# Patient Record
Sex: Male | Born: 1980 | Race: Black or African American | Hispanic: No | Marital: Single | Smoking: Current some day smoker
Health system: Southern US, Community
[De-identification: ages and names within clinical notes are randomized; demographics above are authoritative.]

---

## 2011-04-15 ENCOUNTER — Encounter (HOSPITAL_COMMUNITY): Payer: Self-pay

## 2011-04-15 ENCOUNTER — Emergency Department (HOSPITAL_COMMUNITY)
Admission: EM | Admit: 2011-04-15 | Discharge: 2011-04-15 | Disposition: A | Discharge status: Home / Self Care | Payer: Self-pay | Attending: Emergency Medicine | Admitting: Emergency Medicine

## 2011-04-15 DIAGNOSIS — K047 Periapical abscess without sinus: Secondary | ICD-10-CM | POA: Insufficient documentation

## 2011-04-15 MED ORDER — AMOXICILLIN 500 MG PO CAPS: 500.0000 mg | ORAL_CAPSULE | Freq: Three times a day (TID) | ORAL | Status: AC

## 2011-04-15 MED ORDER — IBUPROFEN 600 MG PO TABS: 600.0000 mg | ORAL_TABLET | Freq: Four times a day (QID) | ORAL | Status: AC | PRN

## 2011-04-15 MED ORDER — HYDROCODONE-ACETAMINOPHEN 5-325 MG PO TABS: 1.0000 | ORAL_TABLET | ORAL | Status: AC | PRN

## 2011-04-15 NOTE — ED Provider Notes (Signed)
History     CSN: [id number]  Arrival date & time 04/15/11  1200   First MD Initiated Contact with Patient 04/15/11 1217      Chief Complaint  Patient presents with  . Dental Pain    (Consider location/radiation/quality/duration/timing/severity/associated sxs/prior treatment) Patient is a 31 y.o. male presenting with tooth pain. The history is provided by the patient.  Dental PainThe primary symptoms include mouth pain. Primary symptoms do not include headaches, fever, shortness of breath or sore throat. The symptoms began more than 1 week ago (has has chronic low grade pain in his left lower 2nd molar tooth which has worsened and is now swollen for the past week). The symptoms are worsening. The symptoms occur constantly.  Additional symptoms include: dental sensitivity to temperature, gum swelling, gum tenderness, jaw pain and facial swelling. Additional symptoms do not include: purulent gums, pain with swallowing, taste disturbance and ear pain.    History reviewed. No pertinent past medical history.  History reviewed. No pertinent past surgical history.  No family history on file.  History  Substance Use Topics  . Smoking status: Current Some Day Smoker  . Smokeless tobacco: Not on file  . Alcohol Use: No      Review of Systems  Constitutional: Negative for fever.  HENT: Positive for facial swelling and dental problem. Negative for ear pain, congestion, sore throat and neck pain.   Eyes: Negative.   Respiratory: Negative for chest tightness and shortness of breath.   Cardiovascular: Negative for chest pain.  Gastrointestinal: Negative for nausea and abdominal pain.  Genitourinary: Negative.   Musculoskeletal: Negative for joint swelling and arthralgias.  Skin: Negative.  Negative for rash and wound.  Neurological: Negative for dizziness, weakness, light-headedness, numbness and headaches.  Hematological: Negative.   Psychiatric/Behavioral: Negative.     Allergies    Review of patient's allergies indicates no known allergies.  Home Medications   Current Outpatient Rx  Name Route Sig Dispense Refill  . AMOXICILLIN 500 MG PO CAPS Oral Take 1 capsule (500 mg total) by mouth 3 (three) times daily. 30 capsule 0  . HYDROCODONE-ACETAMINOPHEN 5-325 MG PO TABS Oral Take 1 tablet by mouth every 4 (four) hours as needed for pain. 15 tablet 0  . IBUPROFEN 600 MG PO TABS Oral Take 1 tablet (600 mg total) by mouth every 6 (six) hours as needed for pain. 30 tablet 0    BP 119/80  Pulse 93  Temp(Src) 99.5 F (37.5 C) (Oral)  Resp 16  Ht 5\' 10"  (1.778 m)  Wt 185 lb (83.915 kg)  BMI 26.54 kg/m2  SpO2 100%  Physical Exam  Constitutional: He is oriented to person, place, and time. He appears well-developed and well-nourished. No distress.  HENT:  Head: Atraumatic.    Right Ear: Tympanic membrane and external ear normal.  Left Ear: Tympanic membrane and external ear normal.  Mouth/Throat: Oropharynx is clear and moist and mucous membranes are normal. No oral lesions. Dental abscesses present.    Eyes: Conjunctivae are normal.  Neck: Normal range of motion. Neck supple.  Cardiovascular: Normal rate and normal heart sounds.   Pulmonary/Chest: Effort normal.  Abdominal: He exhibits no distension.  Musculoskeletal: Normal range of motion.  Lymphadenopathy:    He has no cervical adenopathy.  Neurological: He is alert and oriented to person, place, and time.  Skin: Skin is warm and dry. No erythema.  Psychiatric: He has a normal mood and affect.    ED Course  Procedures (including critical  care time)  Labs Reviewed - No data to display No results found.   1. Dental abscess       MDM  Amoxil,  Ibuprofen,  Hydrocodone.  Dental referral        Candis Musa, [place] 04/15/11 1313

## 2011-04-15 NOTE — ED Notes (Signed)
Pt reports pain in left lower jaw for a little over a week.

## 2011-04-15 NOTE — ED Notes (Signed)
Dental pain lt jaw x 1 month.  Alert, NAD .  Says it feels like abscess he has had in past

## 2011-04-17 NOTE — ED Provider Notes (Signed)
Medical screening examination/treatment/procedure(s) were performed by non-physician practitioner and as supervising physician I was immediately available for consultation/collaboration.  Katelee Schupp S. Caelin Rayl, MD 04/17/11 0546 

## 2014-01-21 ENCOUNTER — Emergency Department (HOSPITAL_COMMUNITY): Payer: PRIVATE HEALTH INSURANCE

## 2014-01-21 ENCOUNTER — Emergency Department (HOSPITAL_COMMUNITY)
Admission: EM | Admit: 2014-01-21 | Discharge: 2014-01-21 | Disposition: A | Discharge status: Home / Self Care | Payer: PRIVATE HEALTH INSURANCE | Attending: Emergency Medicine | Admitting: Emergency Medicine

## 2014-01-21 ENCOUNTER — Encounter (HOSPITAL_COMMUNITY): Payer: Self-pay | Admitting: Emergency Medicine

## 2014-01-21 DIAGNOSIS — Y92410 Unspecified street and highway as the place of occurrence of the external cause: Secondary | ICD-10-CM | POA: Diagnosis not present

## 2014-01-21 DIAGNOSIS — S59902A Unspecified injury of left elbow, initial encounter: Secondary | ICD-10-CM | POA: Diagnosis present

## 2014-01-21 DIAGNOSIS — S5002XA Contusion of left elbow, initial encounter: Secondary | ICD-10-CM

## 2014-01-21 DIAGNOSIS — S8012XA Contusion of left lower leg, initial encounter: Secondary | ICD-10-CM | POA: Diagnosis not present

## 2014-01-21 DIAGNOSIS — Z720 Tobacco use: Secondary | ICD-10-CM | POA: Diagnosis not present

## 2014-01-21 DIAGNOSIS — Y9389 Activity, other specified: Secondary | ICD-10-CM | POA: Insufficient documentation

## 2014-01-21 MED ORDER — NAPROXEN 500 MG PO TABS: 500.0000 mg | ORAL_TABLET | Freq: Two times a day (BID) | ORAL | Status: DC

## 2014-01-21 MED ORDER — CYCLOBENZAPRINE HCL 10 MG PO TABS: 10.0000 mg | ORAL_TABLET | Freq: Three times a day (TID) | ORAL | Status: DC | PRN

## 2014-01-21 NOTE — ED Notes (Signed)
Patient with no complaints at this time. Respirations even and unlabored. Skin warm/dry. Discharge instructions reviewed with patient at this time. Patient given opportunity to voice concerns/ask questions. Patient discharged at this time and left Emergency Department with steady gait.   

## 2014-01-21 NOTE — Discharge Instructions (Signed)
Contusion °A contusion is a deep bruise. Contusions happen when an injury causes bleeding under the skin. Signs of bruising include pain, puffiness (swelling), and discolored skin. The contusion may turn blue, purple, or yellow. °HOME CARE  °· Put ice on the injured area. °¨ Put ice in a plastic bag. °¨ Place a towel between your skin and the bag. °¨ Leave the ice on for 15-20 minutes, 03-04 times a day. °· Only take medicine as told by your doctor. °· Rest the injured area. °· If possible, raise (elevate) the injured area to lessen puffiness. °GET HELP RIGHT AWAY IF:  °· You have more bruising or puffiness. °· You have pain that is getting worse. °· Your puffiness or pain is not helped by medicine. °MAKE SURE YOU:  °· Understand these instructions. °· Will watch your condition. °· Will get help right away if you are not doing well or get worse. °Document Released: 09/09/2007 Document Revised: 06/15/2011 Document Reviewed: 01/26/2011 °ExitCare® Patient Information ©2015 [employer]. This information is not intended to replace advice given to you by your health care provider. Make sure you discuss any questions you have with your health care provider. ° °
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
## 2014-01-22 ENCOUNTER — Encounter (HOSPITAL_COMMUNITY): Payer: Self-pay | Admitting: Emergency Medicine

## 2014-01-22 ENCOUNTER — Emergency Department (HOSPITAL_COMMUNITY): Payer: No Typology Code available for payment source

## 2014-01-22 ENCOUNTER — Emergency Department (HOSPITAL_COMMUNITY)
Admission: EM | Admit: 2014-01-22 | Discharge: 2014-01-22 | Disposition: A | Discharge status: Home / Self Care | Payer: No Typology Code available for payment source | Attending: Emergency Medicine | Admitting: Emergency Medicine

## 2014-01-22 DIAGNOSIS — S39012A Strain of muscle, fascia and tendon of lower back, initial encounter: Secondary | ICD-10-CM | POA: Insufficient documentation

## 2014-01-22 DIAGNOSIS — Z720 Tobacco use: Secondary | ICD-10-CM | POA: Diagnosis not present

## 2014-01-22 DIAGNOSIS — T148XXA Other injury of unspecified body region, initial encounter: Secondary | ICD-10-CM

## 2014-01-22 DIAGNOSIS — S3992XA Unspecified injury of lower back, initial encounter: Secondary | ICD-10-CM | POA: Diagnosis present

## 2014-01-22 MED ORDER — DICLOFENAC SODIUM 75 MG PO TBEC: 75.0000 mg | DELAYED_RELEASE_TABLET | Freq: Two times a day (BID) | ORAL | Status: DC

## 2014-01-22 MED ORDER — HYDROCODONE-ACETAMINOPHEN 5-325 MG PO TABS: 1.0000 | ORAL_TABLET | ORAL | Status: DC | PRN
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
## 2014-01-23 NOTE — ED Provider Notes (Signed)
Medical screening examination/treatment/procedure(s) were performed by non-physician practitioner and as supervising physician I was immediately available for consultation/collaboration.   EKG Interpretation None      Devoria AlbeIva Juan Kissoon, MD, Armando GangFACEP   Ward GivensIva L Trinady Milewski, MD 01/23/14 1550

## 2014-01-26 NOTE — ED Provider Notes (Signed)
Medical screening examination/treatment/procedure(s) were performed by non-physician practitioner and as supervising physician I was immediately available for consultation/collaboration.   EKG Interpretation None       Ruther Ephraim, MD 01/26/14 1846 

## 2015-01-12 ENCOUNTER — Emergency Department (HOSPITAL_COMMUNITY)
Admission: EM | Admit: 2015-01-12 | Discharge: 2015-01-12 | Disposition: A | Discharge status: Home / Self Care | Payer: PRIVATE HEALTH INSURANCE | Attending: Emergency Medicine | Admitting: Emergency Medicine

## 2015-01-12 ENCOUNTER — Encounter (HOSPITAL_COMMUNITY): Payer: Self-pay | Admitting: Emergency Medicine

## 2015-01-12 DIAGNOSIS — Z720 Tobacco use: Secondary | ICD-10-CM | POA: Insufficient documentation

## 2015-01-12 DIAGNOSIS — K047 Periapical abscess without sinus: Secondary | ICD-10-CM | POA: Diagnosis not present

## 2015-01-12 DIAGNOSIS — Z7982 Long term (current) use of aspirin: Secondary | ICD-10-CM | POA: Diagnosis not present

## 2015-01-12 DIAGNOSIS — K0889 Other specified disorders of teeth and supporting structures: Secondary | ICD-10-CM | POA: Diagnosis present

## 2015-01-12 MED ORDER — AMOXICILLIN 250 MG PO CAPS
500.0000 mg | ORAL_CAPSULE | Freq: Once | ORAL | Status: AC
  Administered 2015-01-12: 500 mg via ORAL
  Filled 2015-01-12: qty 2

## 2015-01-12 MED ORDER — TRAMADOL HCL 50 MG PO TABS
50.0000 mg | ORAL_TABLET | Freq: Once | ORAL | Status: AC
  Administered 2015-01-12: 50 mg via ORAL
  Filled 2015-01-12: qty 1

## 2015-01-12 MED ORDER — TRAMADOL HCL 50 MG PO TABS: 50.0000 mg | ORAL_TABLET | Freq: Four times a day (QID) | ORAL | Status: DC | PRN

## 2015-01-12 MED ORDER — AMOXICILLIN 500 MG PO CAPS: 500.0000 mg | ORAL_CAPSULE | Freq: Three times a day (TID) | ORAL | Status: AC

## 2015-01-12 NOTE — ED Provider Notes (Signed)
CSN: [id number]     Arrival date & time 01/12/15  1652 History   First MD Initiated Contact with Patient 01/12/15 1700     Chief Complaint  Patient presents with  . Dental Pain     (Consider location/radiation/quality/duration/timing/severity/associated sxs/prior Treatment) The history is provided by the patient.   Demorio Featherston is a 34 y.o. male presenting with a 3 day history of dental pain and gingival swelling.   The patient has a history of  decay in the tooth involved which has recently started to cause increased  pain.  There has been no fevers, chills, nausea or vomiting, also no complaint of difficulty swallowing, although chewing makes pain worse.  The patient has tried warm salt water swishes and aspirin without relief of symptoms.        History reviewed. No pertinent past medical history. History reviewed. No pertinent past surgical history. History reviewed. No pertinent family history. Social History  Substance Use Topics  . Smoking status: Current Some Day Smoker -- 0.50 packs/day    Types: Cigarettes  . Smokeless tobacco: None  . Alcohol Use: No    Review of Systems  Constitutional: Negative for fever.  HENT: Positive for dental problem. Negative for facial swelling and sore throat.   Respiratory: Negative for shortness of breath.   Musculoskeletal: Negative for neck pain and neck stiffness.      Allergies  Ibuprofen  Home Medications   Prior to Admission medications   Medication Sig Start Date End Date Taking? Authorizing Provider  aspirin EC 81 MG tablet Take 324 mg by mouth once as needed (for pain (dental).   Yes Historical Provider, MD  amoxicillin (AMOXIL) 500 MG capsule Take 1 capsule (500 mg total) by mouth 3 (three) times daily. 01/12/15 01/22/15  Burgess Amor, PA-C  traMADol (ULTRAM) 50 MG tablet Take 1 tablet (50 mg total) by mouth every 6 (six) hours as needed. 01/12/15   Burgess Amor, PA-C   BP 128/87 mmHg  Pulse 77  Temp(Src) 98.6 F (37  C) (Oral)  Resp 20  Ht  (1.778 m)  Wt 185 lb (83.915 kg)  BMI 26.54 kg/m2  SpO2 100% Physical Exam  Constitutional: He is oriented to person, place, and time. He appears well-developed and well-nourished. No distress.  HENT:  Head: Normocephalic and atraumatic.  Right Ear: Tympanic membrane and external ear normal.  Left Ear: Tympanic membrane and external ear normal.  Mouth/Throat: Oropharynx is clear and moist and mucous membranes are normal. No oral lesions. No trismus in the jaw. Dental abscesses present.    Eyes: Conjunctivae are normal.  Neck: Normal range of motion. Neck supple.  Cardiovascular: Normal rate and normal heart sounds.   Pulmonary/Chest: Effort normal.  Abdominal: He exhibits no distension.  Musculoskeletal: Normal range of motion.  Lymphadenopathy:    He has no cervical adenopathy.  Neurological: He is alert and oriented to person, place, and time.  Skin: Skin is warm and dry. No erythema.  Psychiatric: He has a normal mood and affect.    ED Course  Procedures (including critical care time) Labs Review Labs Reviewed - No data to display  Imaging Review No results found. I have personally reviewed and evaluated these images and lab results as part of my medical decision-making.   EKG Interpretation None      MDM   Final diagnoses:  Dental infection    Patient was prescribed amoxicillin, tramadol. First dose is given here. Is given dental referrals.  Burgess Amor, PA-C 01/12/15 1829  Vanetta Mulders, MD 01/13/15 [phone]

## 2015-01-12 NOTE — ED Notes (Addendum)
Pt reports wisdom tooth is attempting to come in on left lower side. Pt reports it is broken and looks "rotten". Pt denies any known injury. nad noted.

## 2015-01-12 NOTE — Discharge Instructions (Signed)

## 2016-07-18 ENCOUNTER — Encounter (HOSPITAL_COMMUNITY): Payer: Self-pay | Admitting: *Deleted

## 2016-07-18 ENCOUNTER — Emergency Department (HOSPITAL_COMMUNITY)
Admission: EM | Admit: 2016-07-18 | Discharge: 2016-07-18 | Disposition: A | Discharge status: Home / Self Care | Payer: PRIVATE HEALTH INSURANCE | Attending: Emergency Medicine | Admitting: Emergency Medicine

## 2016-07-18 DIAGNOSIS — H10023 Other mucopurulent conjunctivitis, bilateral: Secondary | ICD-10-CM

## 2016-07-18 DIAGNOSIS — H109 Unspecified conjunctivitis: Secondary | ICD-10-CM | POA: Insufficient documentation

## 2016-07-18 DIAGNOSIS — H00011 Hordeolum externum right upper eyelid: Secondary | ICD-10-CM | POA: Insufficient documentation

## 2016-07-18 DIAGNOSIS — Z7982 Long term (current) use of aspirin: Secondary | ICD-10-CM | POA: Insufficient documentation

## 2016-07-18 DIAGNOSIS — F17210 Nicotine dependence, cigarettes, uncomplicated: Secondary | ICD-10-CM | POA: Insufficient documentation

## 2016-07-18 MED ORDER — TOBRAMYCIN 0.3 % OP SOLN
2.0000 [drp] | Freq: Once | OPHTHALMIC | Status: AC
  Administered 2016-07-18: 2 [drp] via OPHTHALMIC
  Filled 2016-07-18: qty 5

## 2016-07-18 NOTE — ED Triage Notes (Signed)
Pt c/o swollen eyelids, itchy red eyes. Pt reports this started in the right eye 1 week ago and then started in left eye yesterday. Pt reports he wakes up and his eyes are crusted over. Cream colored drainage from right eye.

## 2016-07-18 NOTE — ED Notes (Signed)
Pt made aware to return if symptoms worsen or if any life threatening symptoms occur.   

## 2016-07-18 NOTE — ED Provider Notes (Signed)
[facility]-EMERGENCY DEPT Provider Note   CSN: [id number] Arrival date & time: 07/18/16  1050     History   Chief Complaint Chief Complaint  Patient presents with  . Eye Pain    HPI Scott Odom is a 36 y.o. male presenting with a one week history of right upper eyelid swelling and discomfort along with right eye redness with yellow drainage and thick matting when he first wakes.  Yesterday his left eye also started getting red and irritated.  He denies vision changes.  He has used warm compresses for the right eye with reduction in swelling.  He has found no other alleviators.  Denies injury.  Does not wear contacts or glasses, but says he is supposed to wear glasses. Possible pink eye contact.  The history is provided by the patient.    History reviewed. No pertinent past medical history.  There are no active problems to display for this patient.   History reviewed. No pertinent surgical history.     Home Medications    Prior to Admission medications   Medication Sig Start Date End Date Taking? Authorizing Provider  aspirin EC 81 MG tablet Take 324 mg by mouth once as needed (for pain (dental).    Historical Provider, MD  ciprofloxacin (CILOXAN) 0.3 % ophthalmic solution Place 2 drops into both eyes every 4 (four) hours while awake. Administer 1 drop, every 2 hours, while awake, for 2 days. Then 1 drop, every 4 hours, while awake, for the next 5 days. 07/19/16   Azalia Bilis, MD  traMADol (ULTRAM) 50 MG tablet Take 1 tablet (50 mg total) by mouth every 6 (six) hours as needed. 01/12/15   Burgess Amor, PA-C    Family History No family history on file.  Social History Social History  Substance Use Topics  . Smoking status: Current Some Day Smoker    Packs/day: 0.50    Types: Cigarettes  . Smokeless tobacco: Never Used  . Alcohol use Yes     Comment: occasionally      Allergies   Ibuprofen   Review of Systems Review of Systems  Constitutional: Negative for fever.    HENT: Negative for congestion and sore throat.   Eyes: Positive for discharge, redness and itching. Negative for visual disturbance.  Genitourinary: Negative.   Musculoskeletal: Negative for arthralgias and joint swelling.  Skin: Negative.  Negative for rash and wound.  Neurological: Negative for dizziness, weakness, light-headedness, numbness and headaches.  Psychiatric/Behavioral: Negative.      Physical Exam Updated Vital Signs BP 132/76 (BP Location: Right Arm)   Pulse 77   Temp 98.7 F (37.1 C) (Oral)   Resp 16   Ht 5' 9.5" (1.765 m)   Wt 83.9 kg   SpO2 99%   BMI 26.93 kg/m   Physical Exam  Constitutional: He is oriented to person, place, and time. He appears well-developed and well-nourished.  HENT:  Head: Normocephalic and atraumatic.  Right Ear: Tympanic membrane and ear canal normal.  Left Ear: Tympanic membrane and ear canal normal.  Nose: Mucosal edema and rhinorrhea present.  Mouth/Throat: Uvula is midline, oropharynx is clear and moist and mucous membranes are normal. No oropharyngeal exudate, posterior oropharyngeal edema, posterior oropharyngeal erythema or tonsillar abscesses.  Eyes: EOM are normal. Pupils are equal, round, and reactive to light. Right eye exhibits discharge and hordeolum. Right eye exhibits no chemosis. Left eye exhibits no chemosis. Right conjunctiva is injected. Left conjunctiva is injected.  Bilateral Distance: 20/20 R Distance: 20/50 L  Distance: 20/20    Cardiovascular: Normal rate and normal heart sounds.   Pulmonary/Chest: Effort normal. No respiratory distress. He has no wheezes. He has no rales.  Abdominal: Soft. There is no tenderness.  Musculoskeletal: Normal range of motion.  Neurological: He is alert and oriented to person, place, and time.  Skin: Skin is warm and dry. No rash noted.  Psychiatric: He has a normal mood and affect.     ED Treatments / Results  Labs (all labs ordered are listed, but only abnormal results  are displayed) Labs Reviewed - No data to display  EKG  EKG Interpretation None       Radiology No results found.  Procedures Procedures (including critical care time)  Medications Ordered in ED Medications  tobramycin (TOBREX) 0.3 % ophthalmic solution 2 drop (2 drops Both Eyes Given 07/18/16 1145)     Initial Impression / Assessment and Plan / ED Course  I have reviewed the triage vital signs and the nursing notes.  Pertinent labs & imaging results that were available during my care of the patient were reviewed by me and considered in my medical decision making (see chart for details).     Right eye with upper lid sty, also bilateral conjunctivitis.  No vision changes.  tobrex, warm soaks, avoid rubbing eye. Referral to ophthal. Prn, also discussed return precautions if sx are not improving with tx.  Final Clinical Impressions(s) / ED Diagnoses   Final diagnoses:  Hordeolum externum of right upper eyelid  Pink eye, bilateral    New Prescriptions Discharge Medication List as of 07/18/2016 12:19 PM       Burgess Amor, PA-C 07/20/16 1138    Bethann Berkshire, MD 07/28/16 2146

## 2016-07-18 NOTE — Discharge Instructions (Signed)
Apply one drop of the antibiotics given in each eye every 4 hours while awake for the next 7 days. Continue to apply warm compresses as you are doing for the stye.  Use baby shampoo to wash your eyelids twice daily can also be helpful.

## 2016-07-19 ENCOUNTER — Encounter (HOSPITAL_COMMUNITY): Payer: Self-pay | Admitting: Emergency Medicine

## 2016-07-19 ENCOUNTER — Emergency Department (HOSPITAL_COMMUNITY)
Admission: EM | Admit: 2016-07-19 | Discharge: 2016-07-19 | Disposition: A | Discharge status: Home / Self Care | Payer: PRIVATE HEALTH INSURANCE | Attending: Emergency Medicine | Admitting: Emergency Medicine

## 2016-07-19 DIAGNOSIS — F17210 Nicotine dependence, cigarettes, uncomplicated: Secondary | ICD-10-CM | POA: Insufficient documentation

## 2016-07-19 DIAGNOSIS — H0011 Chalazion right upper eyelid: Secondary | ICD-10-CM | POA: Insufficient documentation

## 2016-07-19 DIAGNOSIS — Z7982 Long term (current) use of aspirin: Secondary | ICD-10-CM | POA: Insufficient documentation

## 2016-07-19 DIAGNOSIS — H1033 Unspecified acute conjunctivitis, bilateral: Secondary | ICD-10-CM | POA: Insufficient documentation

## 2016-07-19 MED ORDER — CIPROFLOXACIN HCL 0.3 % OP SOLN: 2.0000 [drp] | OPHTHALMIC | 0 refills | Status: DC

## 2016-07-19 NOTE — ED Provider Notes (Signed)
[facility]-EMERGENCY DEPT Provider Note   CSN: [id number] Arrival date & time: 07/19/16  1109  By signing my name below, I, Scott Odom, attest that this documentation has been prepared under the direction and in the presence of Azalia Bilis, MD.  Electronically Signed: Octavia Odom, ED Scribe. 07/19/16. 12:48 PM.    History   Chief Complaint Chief Complaint  Patient presents with  . Eye Drainage   The history is provided by the patient. No language interpreter was used.   HPI Comments: Scott Odom is a 36 y.o. male who presents to the Emergency Department complaining of moderate, progressively worsening, bilateral eye pain x one week. He reports having associated crusting of bilateral eyes, swelling, and redness both eyes. Pt says his right eye started one week ago and his left eye symptoms started yesterday. He has been applying warm compresses to the area without relief. Pt does not wear contacts. He denies fever.   History reviewed. No pertinent past medical history.  There are no active problems to display for this patient.   History reviewed. No pertinent surgical history.     Home Medications    Prior to Admission medications   Medication Sig Start Date End Date Taking? Authorizing Provider  aspirin EC 81 MG tablet Take 324 mg by mouth once as needed (for pain (dental).    Historical Provider, MD  traMADol (ULTRAM) 50 MG tablet Take 1 tablet (50 mg total) by mouth every 6 (six) hours as needed. 01/12/15   Burgess Amor, PA-C    Family History No family history on file.  Social History Social History  Substance Use Topics  . Smoking status: Current Some Day Smoker    Packs/day: 0.50    Types: Cigarettes  . Smokeless tobacco: Never Used  . Alcohol use Yes     Comment: occasionally      Allergies   Ibuprofen   Review of Systems Review of Systems  All other systems reviewed and are negative for acute changes except as noted in the HPI.   Physical  Exam Updated Vital Signs BP 118/88 (BP Location: Left Arm)   Pulse 92   Temp 98.3 F (36.8 C) (Oral)   Resp 20   Ht 5' 9.5" (1.765 m)   Wt 185 lb (83.9 kg)   SpO2 97%   BMI 26.93 kg/m   Physical Exam  Constitutional: He is oriented to person, place, and time. He appears well-developed and well-nourished.  HENT:  Head: Normocephalic.  Eyes:  Injection of bilateral conjunctiva.  Mild swelling of bilateral upper lids.  No obvious crusting at this time.  Small chalazion right upper lid without surrounding erythema.  Neck: Normal range of motion.  Pulmonary/Chest: Effort normal.  Abdominal: He exhibits no distension.  Musculoskeletal: Normal range of motion.  Neurological: He is alert and oriented to person, place, and time.  Psychiatric: He has a normal mood and affect.  Nursing note and vitals reviewed.    ED Treatments / Results  DIAGNOSTIC STUDIES: Oxygen Saturation is 97% on RA, normal by my interpretation.  COORDINATION OF CARE:  12:39 PM Discussed treatment plan with pt at bedside and pt agreed to plan.  Labs (all labs ordered are listed, but only abnormal results are displayed) Labs Reviewed - No data to display  EKG  EKG Interpretation None       Radiology No results found.  Procedures Procedures (including critical care time)  Medications Ordered in ED Medications - No data to display  Initial Impression / Assessment and Plan / ED Course  I have reviewed the triage vital signs and the nursing notes.  Pertinent labs & imaging results that were available during my care of the patient were reviewed by me and considered in my medical decision making (see chart for details).     Patient is overall well-appearing.  I suspect this is a conjunctivitis.  He'll be started on antibiotic drops.  Warm compresses for his chalazion.  Referral to ophthalmology if he does not improve.  Final Clinical Impressions(s) / ED Diagnoses   Final diagnoses:  None    I personally performed the services described in this documentation, which was scribed in my presence. The recorded information has been reviewed and is accurate.     New Prescriptions New Prescriptions   No medications on file     Azalia Bilis, MD 07/19/16 1300

## 2016-07-19 NOTE — ED Triage Notes (Signed)
Pt c/o pain and drainage to left eye onset yesterday. Pt reports continues to have pain and drainage to right eye for more than 1 week. Pt presents with redness and swelling to B/L eyes

## 2020-06-17 ENCOUNTER — Emergency Department (HOSPITAL_COMMUNITY)
Admission: EM | Admit: 2020-06-17 | Discharge: 2020-06-17 | Disposition: A | Discharge status: Home / Self Care | Payer: Self-pay | Attending: Emergency Medicine | Admitting: Emergency Medicine

## 2020-06-17 ENCOUNTER — Other Ambulatory Visit: Payer: Self-pay

## 2020-06-17 ENCOUNTER — Emergency Department (HOSPITAL_COMMUNITY): Payer: Self-pay

## 2020-06-17 DIAGNOSIS — R519 Headache, unspecified: Secondary | ICD-10-CM | POA: Insufficient documentation

## 2020-06-17 DIAGNOSIS — F17210 Nicotine dependence, cigarettes, uncomplicated: Secondary | ICD-10-CM | POA: Insufficient documentation

## 2020-06-17 DIAGNOSIS — Z7982 Long term (current) use of aspirin: Secondary | ICD-10-CM | POA: Insufficient documentation

## 2020-06-17 MED ORDER — SODIUM CHLORIDE 0.9 % IV SOLN: INTRAVENOUS | Status: DC

## 2020-06-17 MED ORDER — DIPHENHYDRAMINE HCL 50 MG/ML IJ SOLN
12.5000 mg | Freq: Once | INTRAMUSCULAR | Status: AC
  Administered 2020-06-17: 12.5 mg via INTRAVENOUS
  Filled 2020-06-17: qty 1

## 2020-06-17 MED ORDER — PROCHLORPERAZINE EDISYLATE 10 MG/2ML IJ SOLN
10.0000 mg | Freq: Once | INTRAMUSCULAR | Status: AC
  Administered 2020-06-17: 10 mg via INTRAVENOUS
  Filled 2020-06-17: qty 2

## 2020-06-17 MED ORDER — SODIUM CHLORIDE 0.9 % IV BOLUS
1000.0000 mL | Freq: Once | INTRAVENOUS | Status: AC
  Administered 2020-06-17: 1000 mL via INTRAVENOUS

## 2020-06-17 NOTE — Discharge Instructions (Signed)
May take Tylenol and ibuprofen as needed  Return for new or worsening symptoms.

## 2020-06-17 NOTE — ED Provider Notes (Signed)
[facility] EMERGENCY DEPARTMENT Provider Note   CSN: [id number] Arrival date & time: 06/17/20  1647     History Chief Complaint  Patient presents with  . Headache    Scott Odom is a 40 y.o. male with no significant past medical history who presents for evaluation of headache.  Has had headache over the last 2 weeks.  States when he goes to bed at night when he wakes up in the morning his headache resolves have however after being up for approximately 2 hours his headache returned.  States he has some photophobia without phonophobia.  He took 1 dose of tramadol which did not help, subsequently has not taken anything else for his pain.  Describes as a tight banding across the front of his head.  He denies any blurred vision, diplopia.  He has no neck stiffness or neck rigidity.  No fever, chills, nausea, vomiting.  No associated paresthesias, difficulty with word finding, weakness, facial droop.  No recent head trauma.  No chest pain, shortness breath abdominal pain, diarrhea, dysuria.  Headache is nonpositional in nature.  Denies any recent lumbar punctures.  Rates current pain at 8/10.  Had never had a headache previously.  Denies any sudden onset thunderclap headache.  Denies additional rating or alleviating factors.  Rates his pain a 8/10.  History obtained from patient and past medical records.  No interpreter used  HPI     No past medical history on file.  There are no problems to display for this patient.   No past surgical history on file.     No family history on file.  Social History   Tobacco Use  . Smoking status: Current Some Day Smoker    Packs/day: 0.50    Types: Cigarettes  . Smokeless tobacco: Never Used  Substance Use Topics  . Alcohol use: Yes    Comment: occasionally   . Drug use: No    Home Medications Prior to Admission medications   Medication Sig Start Date End Date Taking? Authorizing Provider  aspirin EC 81 MG tablet Take 324 mg by mouth  once as needed (for pain (dental). Patient not taking: Reported on 06/17/2020    [provider]  ciprofloxacin (CILOXAN) 0.3 % ophthalmic solution Place 2 drops into both eyes every 4 (four) hours while awake. Administer 1 drop, every 2 hours, while awake, for 2 days. Then 1 drop, every 4 hours, while awake, for the next 5 days. Patient not taking: No sig reported 07/19/16   Azalia Bilis, MD  traMADol (ULTRAM) 50 MG tablet Take 1 tablet (50 mg total) by mouth every 6 (six) hours as needed. Patient not taking: No sig reported 01/12/15   Burgess Amor, PA-C    Allergies    Ibuprofen  Review of Systems   Review of Systems  Constitutional: Negative.   HENT: Negative.   Eyes: Negative.   Respiratory: Negative.   Cardiovascular: Negative.   Gastrointestinal: Negative.   Genitourinary: Negative.   Musculoskeletal: Negative.   Skin: Negative.   Neurological: Positive for headaches. Negative for dizziness, tremors, syncope, facial asymmetry, speech difficulty, weakness, light-headedness and numbness.  All other systems reviewed and are negative.   Physical Exam Updated Vital Signs BP (!) 125/95 (BP Location: Right Arm)   Pulse 85   Temp 98.1 F (36.7 C) (Oral)   Resp 14   Ht 5' 9.5" (1.765 m)   Wt 89.4 kg   SpO2 99%   BMI 28.67 kg/m   Physical Exam  Physical Exam  Constitutional: Pt is oriented to person, place, and time. Pt appears well-developed and well-nourished. No distress.  HENT:  Head: Normocephalic and atraumatic.  Mouth/Throat: Oropharynx is clear and moist.  Eyes: Conjunctivae and EOM are normal. Pupils are equal, round, and reactive to light. No scleral icterus.  No horizontal, vertical or rotational nystagmus  Neck: Normal range of motion. Neck supple.  Full active and passive ROM without pain No midline or paraspinal tenderness No nuchal rigidity or meningeal signs  Cardiovascular: Normal rate, regular rhythm and intact distal pulses.   Pulmonary/Chest:  Effort normal and breath sounds normal. No respiratory distress. Pt has no wheezes. No rales.  Abdominal: Soft. Bowel sounds are normal. There is no tenderness. There is no rebound and no guarding.  Musculoskeletal: Normal range of motion.  Lymphadenopathy:    No cervical adenopathy.  Neurological: Pt. is alert and oriented to person, place, and time. He has normal reflexes. No cranial nerve deficit.  Exhibits normal muscle tone. Coordination normal.  Mental Status:  Alert, oriented, thought content appropriate. Speech fluent without evidence of aphasia. Able to follow 2 step commands without difficulty.  Cranial Nerves:  II:  Peripheral visual fields grossly normal, pupils equal, round, reactive to light III,IV, VI: ptosis not present, extra-ocular motions intact bilaterally  V,VII: smile symmetric, facial light touch sensation equal VIII: hearing grossly normal bilaterally  IX,X: midline uvula rise  XI: bilateral shoulder shrug equal and strong XII: midline tongue extension  Motor:  5/5 in upper and lower extremities bilaterally including strong and equal grip strength and dorsiflexion/plantar flexion Sensory: Pinprick and light touch normal in all extremities.  Deep Tendon Reflexes: 2+ and symmetric  Cerebellar: normal finger-to-nose with bilateral upper extremities Gait: normal gait and balance CV: distal pulses palpable throughout   Skin: Skin is warm and dry. No rash noted. Pt is not diaphoretic.  Psychiatric: Pt has a normal mood and affect. Behavior is normal. Judgment and thought content normal.  Nursing note and vitals reviewed. ED Results / Procedures / Treatments   Labs (all labs ordered are listed, but only abnormal results are displayed) Labs Reviewed - No data to display  EKG None  Radiology CT Head Wo Contrast  Result Date: 06/17/2020 CLINICAL DATA:  Headache EXAM: CT HEAD WITHOUT CONTRAST TECHNIQUE: Contiguous axial images were obtained from the base of the skull  through the vertex without intravenous contrast. COMPARISON:  None. FINDINGS: Brain: There is no acute intracranial hemorrhage, mass effect, or edema. Gray-white differentiation is preserved. There is no extra-axial fluid collection. Ventricles and sulci are within normal limits in size and configuration. Vascular: No hyperdense vessel or unexpected calcification. Skull: Calvarium is unremarkable. Sinuses/Orbits: No acute finding. Other: None. IMPRESSION: Normal CT of the head. Electronically Signed   By: Guadlupe Spanish M.D.   On: 06/17/2020 18:03    Procedures Procedures   Medications Ordered in ED Medications  sodium chloride 0.9 % bolus 1,000 mL (1,000 mLs Intravenous New Bag/Given 06/17/20 1848)    And  0.9 %  sodium chloride infusion (has no administration in time range)  prochlorperazine (COMPAZINE) injection 10 mg (10 mg Intravenous Given 06/17/20 1859)  diphenhydrAMINE (BENADRYL) injection 12.5 mg (12.5 mg Intravenous Given 06/17/20 1859)    ED Course  I have reviewed the triage vital signs and the nursing notes.  Pertinent labs & imaging results that were available during my care of the patient were reviewed by me and considered in my medical decision making (see chart for details).  Patient here for headache x2 weeks.  He is afebrile, nonseptic, not ill-appearing.  He has a nonfocal neuro exam without deficits.  No recent head trauma.  He is not anticoagulated low suspicion for acute bleed.  He denies any sudden onset thunderclap headache.  I low suspicion for [facility].  No neck pain or neck stiffness.  Afebrile.  No meningismus on exam.  Low suspicion for meningitis.  He has no associated paresthesias, weakness, difficulty with word finding.  Low suspicion for ACS or dissection.  Symptoms seem consistent with likely tension headache however given persistent headache x2 weeks will obtain imaging.  Symptoms are nonpositional in nature.  Will treat with migraine cocktail and reassess  CT head  Wo acute findings  Patient reassessed.  Pain controlled.  Continues have nonfocal neuro exam without deficits.  Endorses complete resolve of headache.  Likely tension headache.  Does admit to increased stressors in life.  Discussed symptomatic management, return for any worsening symptoms.  He is agreeable to this.  Presentation non concerning for [facility], [facility], Meningitis, or temporal arteritis. Pt is afebrile with no focal neuro deficits, nuchal rigidity, or change in vision.   The patient has been appropriately medically screened and/or stabilized in the ED. I have low suspicion for any other emergent medical condition which would require further screening, evaluation or treatment in the ED or require inpatient management.  Patient is hemodynamically stable and in no acute distress.  Patient able to ambulate in department prior to ED.  Evaluation does not show acute pathology that would require ongoing or additional emergent interventions while in the emergency department or further inpatient treatment.  I have discussed the diagnosis with the patient and answered all questions.  Pain is been managed while in the emergency department and patient has no further complaints prior to discharge.  Patient is comfortable with plan discussed in room and is stable for discharge at this time.  I have discussed strict return precautions for returning to the emergency department.  Patient was encouraged to follow-up with PCP/specialist refer to at discharge.    MDM Rules/Calculators/A&P                           Final Clinical Impression(s) / ED Diagnoses Final diagnoses:  Acute nonintractable headache, unspecified headache type    Rx / DC Orders ED Discharge Orders    None       Henderly, Britni A, PA-C 06/17/20 2047    Benjiman Core, MD 06/17/20 [phone]

## 2020-06-17 NOTE — ED Triage Notes (Signed)
Pt c/o HA for past 2 weeks.  Denies any other symptoms.

## 2021-07-10 ENCOUNTER — Other Ambulatory Visit: Payer: Self-pay | Admitting: Family Medicine

## 2021-07-10 DIAGNOSIS — K4090 Unilateral inguinal hernia, without obstruction or gangrene, not specified as recurrent: Secondary | ICD-10-CM

## 2021-07-10 DIAGNOSIS — L720 Epidermal cyst: Secondary | ICD-10-CM

## 2021-07-11 ENCOUNTER — Ambulatory Visit
Admission: RE | Admit: 2021-07-11 | Discharge: 2021-07-11 | Disposition: A | Discharge status: Home / Self Care | Payer: Managed Care, Other (non HMO) | Source: Ambulatory Visit | Attending: Family Medicine | Admitting: Family Medicine

## 2021-07-11 DIAGNOSIS — K4090 Unilateral inguinal hernia, without obstruction or gangrene, not specified as recurrent: Secondary | ICD-10-CM

## 2021-07-11 DIAGNOSIS — L720 Epidermal cyst: Secondary | ICD-10-CM

## 2021-07-24 ENCOUNTER — Other Ambulatory Visit: Payer: Self-pay | Admitting: *Deleted

## 2021-07-24 DIAGNOSIS — L720 Epidermal cyst: Secondary | ICD-10-CM

## 2021-07-24 DIAGNOSIS — K4090 Unilateral inguinal hernia, without obstruction or gangrene, not specified as recurrent: Secondary | ICD-10-CM

## 2021-07-31 ENCOUNTER — Encounter: Payer: Self-pay | Admitting: General Surgery

## 2021-07-31 ENCOUNTER — Ambulatory Visit: Payer: Managed Care, Other (non HMO) | Admitting: General Surgery

## 2021-07-31 ENCOUNTER — Other Ambulatory Visit: Payer: Self-pay

## 2021-07-31 VITALS — BP 108/76 | HR 71 | Temp 98.4°F | Resp 14 | Ht 69.0 in | Wt 202.0 lb

## 2021-07-31 DIAGNOSIS — K4090 Unilateral inguinal hernia, without obstruction or gangrene, not specified as recurrent: Secondary | ICD-10-CM | POA: Diagnosis not present

## 2021-07-31 NOTE — Progress Notes (Signed)
Scott Odom; [id number]; 1980/11/06 ? ? ?HPI ?Patient is a 41 year old black male who was referred to my care by Tiana Loft for evaluation and treatment of both a left inguinal hernia and a sebaceous cyst over his right shoulder.  He states he is left inguinal hernia has been present for approximately 2-3 years.  Lately, it stays out.  It only causes him discomfort when it is reduced.  It is made worse with straining.  He denies any nausea or vomiting.  He also has a sebaceous cyst over his right shoulder that has been present for some time.  No drainage has been noted. ?History reviewed. No pertinent past medical history. ? ?History reviewed. No pertinent surgical history. ? ?History reviewed. No pertinent family history. ? ?No current outpatient medications on file prior to visit.  ? ?No current facility-administered medications on file prior to visit.  ? ? ?Allergies  ?Allergen Reactions  ? Ibuprofen Hives  ? ? ?Social History  ? ?Substance and Sexual Activity  ?Alcohol Use Yes  ? Comment: occasionally   ? ? ?Social History  ? ?Tobacco Use  ?Smoking Status Some Days  ? Packs/day: 0.50  ? Types: Cigarettes  ?Smokeless Tobacco Never  ? ? ?Review of Systems  ?Constitutional: Negative.   ?HENT: Negative.    ?Eyes: Negative.   ?Respiratory: Negative.    ?Cardiovascular: Negative.   ?Gastrointestinal:  Positive for heartburn.  ?Genitourinary: Negative.   ?Musculoskeletal: Negative.   ?Skin: Negative.   ?Neurological: Negative.   ?Endo/Heme/Allergies: Negative.   ?Psychiatric/Behavioral: Negative.    ? ?Objective  ? ?Vitals:  ? 07/31/21 1002  ?BP: 108/76  ?Pulse: 71  ?Resp: 14  ?Temp: 98.4 ?F (36.9 ?C)  ?SpO2: 96%  ? ? ?Physical Exam ?Vitals reviewed.  ?Constitutional:   ?   Appearance: Normal appearance. He is not ill-appearing.  ?HENT:  ?   Head: Normocephalic and atraumatic.  ?Cardiovascular:  ?   Rate and Rhythm: Normal rate and regular rhythm.  ?   Heart sounds: Normal heart sounds. No murmur heard. ?  No  friction rub. No gallop.  ?Pulmonary:  ?   Effort: Pulmonary effort is normal. No respiratory distress.  ?   Breath sounds: Normal breath sounds. No stridor. No wheezing, rhonchi or rales.  ?Abdominal:  ?   General: Bowel sounds are normal. There is no distension.  ?   Palpations: Abdomen is soft. There is no mass.  ?   Tenderness: There is no abdominal tenderness. There is no guarding or rebound.  ?   Hernia: A hernia is present.  ?   Comments: A left inguinal hernia is present which is somewhat difficult to reduce.  It seems to contain bowel.  ?Genitourinary: ?   Testes: Normal.  ?Skin: ?   General: Skin is warm and dry.  ?   Comments: 2 cm sebaceous cyst with punctum present over the right shoulder.  No erythema or fluctuance is present.  ?Neurological:  ?   Mental Status: He is alert and oriented to person, place, and time.  ? ? ?Assessment  ?Left inguinal hernia, symptomatic ?Sebaceous cyst, right shoulder ?Plan  ?I did recommend repair of the left inguinal hernia as it does contain bowel and is somewhat difficult to reduce.  He would also like to get the sebaceous cyst removed during the same procedure.  The risks and benefits of the procedures including bleeding, infection, mesh use, and the possibility of recurrence of the hernia were fully explained to the patient,  who gave informed consent.  He will call to schedule surgery. ?

## 2021-10-07 IMAGING — CT CT HEAD W/O CM
3 series · 16 of 47 positions shown, 19 images · non-contrast
Comparison: None.

CLINICAL DATA: Headache

EXAM:
CT HEAD WITHOUT CONTRAST
TECHNIQUE: Contiguous axial images were obtained from the base of the skull
through the vertex without intravenous contrast.

[Series 2: head w o · axial · 0.45mm/px · z∈[-47,+93]mm · 10 of 34 slices shown, 13 images]
[im 3/34  brain]
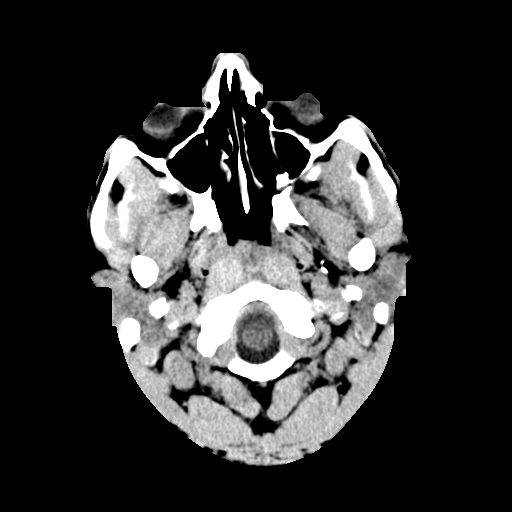
[im 3/34  bone]
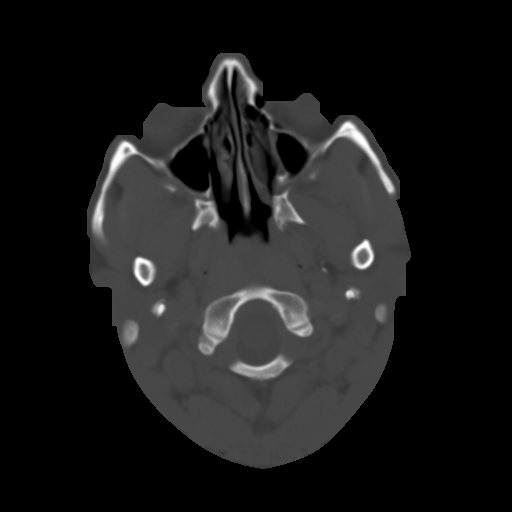
[im 6/34  brain]
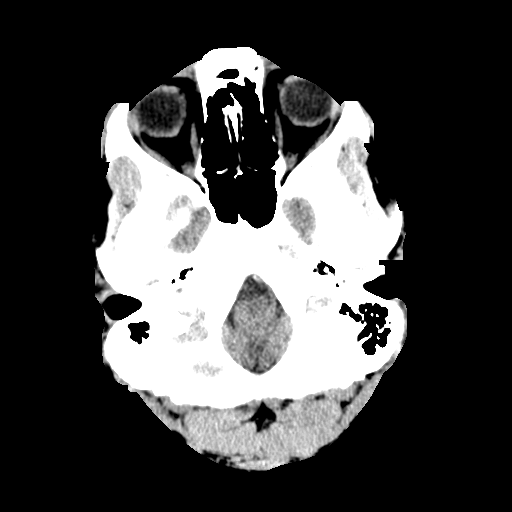
[im 10/34  brain]
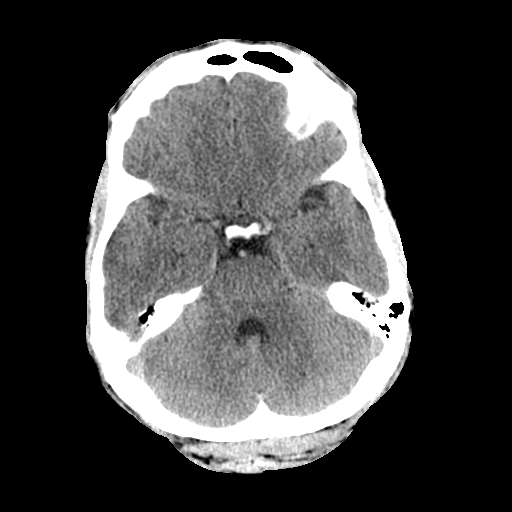
[im 12/34  brain]
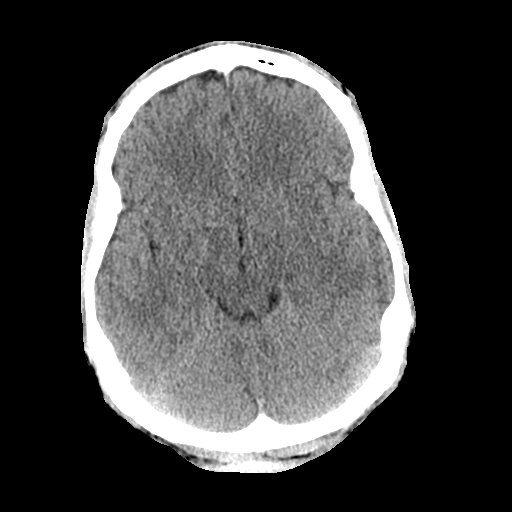
[im 15/34  brain]
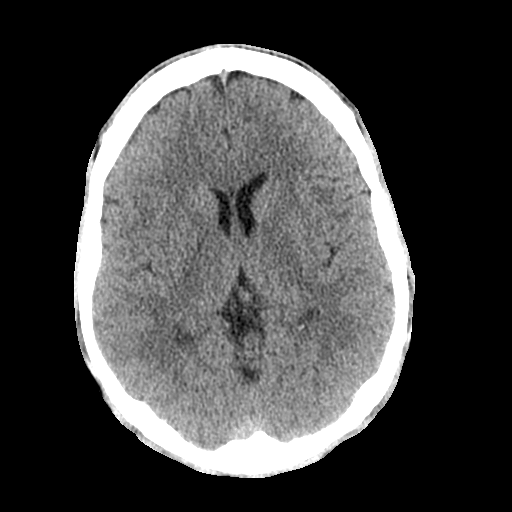
[im 15/34  bone]
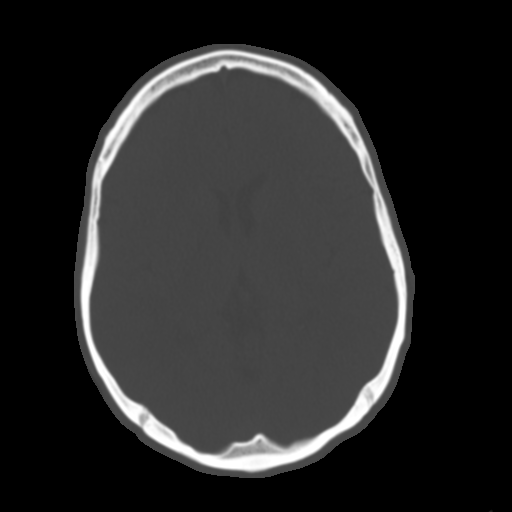
[im 19/34  brain]
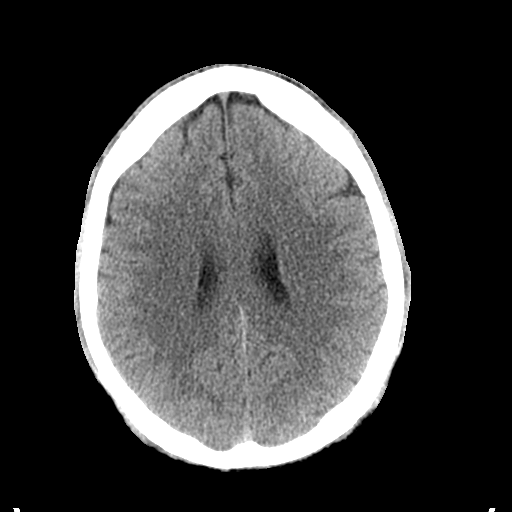
[im 22/34  brain]
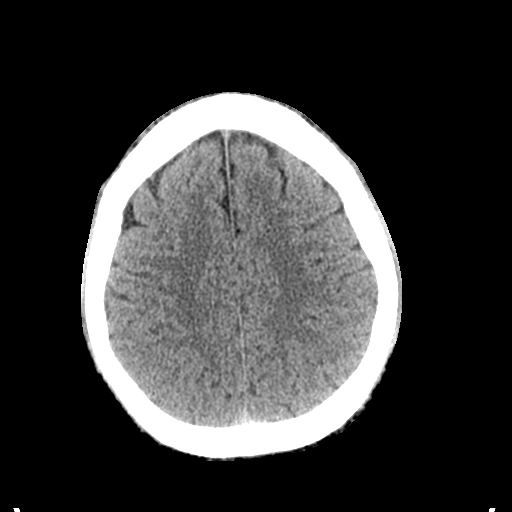
[im 26/34  brain]
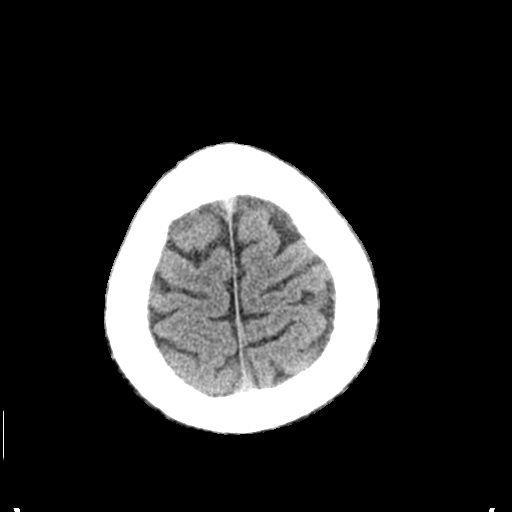
[im 28/34  brain]
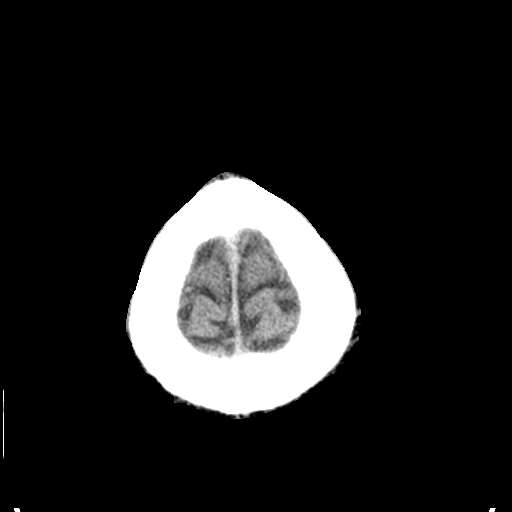
[im 28/34  bone]
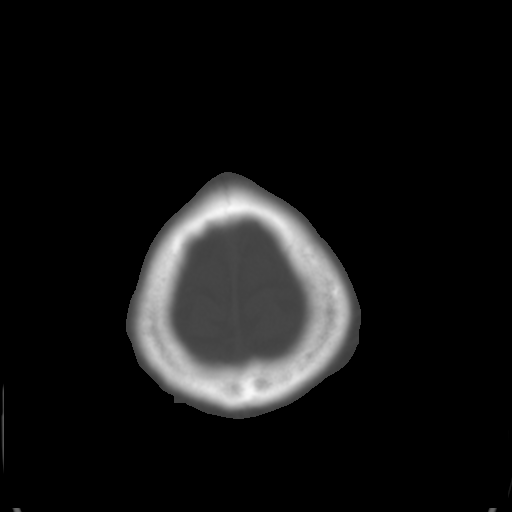
[im 31/34  brain]
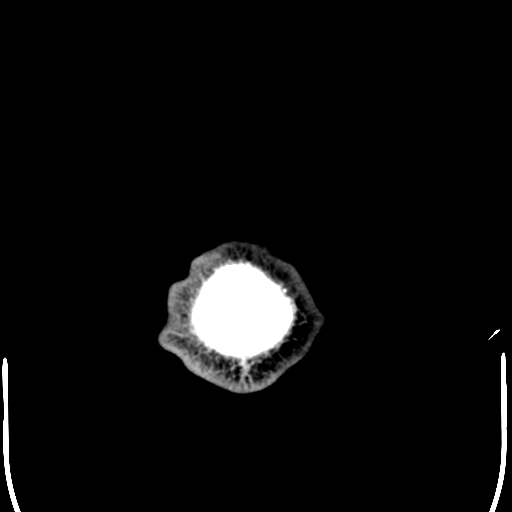

[Series 4: coronal soft · coronal · 0.34mm/px · 3 of 73 slices shown]
[im 25/73  brain]
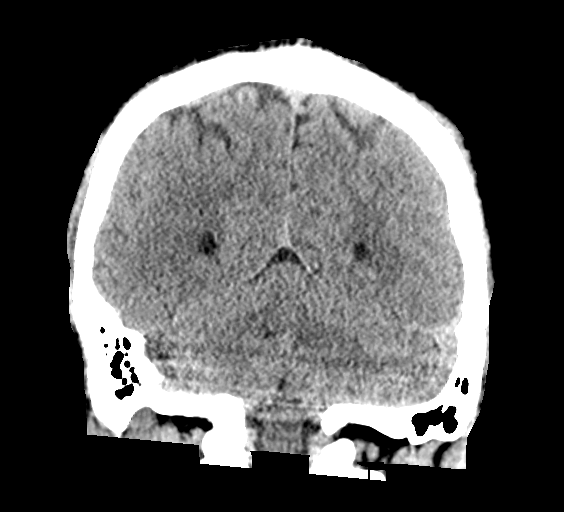
[im 33/73  brain]
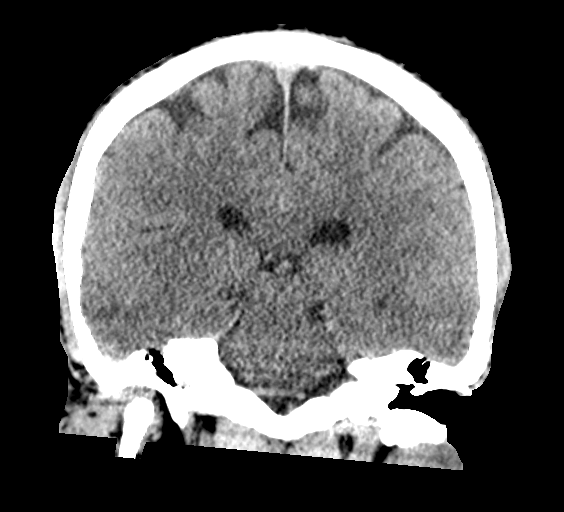
[im 41/73  brain]
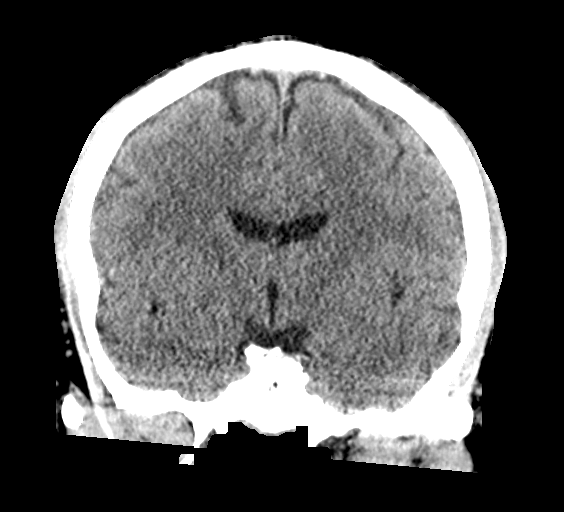

[Series 5: sagittal soft · sagittal · 0.34mm/px · 3 of 60 slices shown]
[im 20/60  brain]
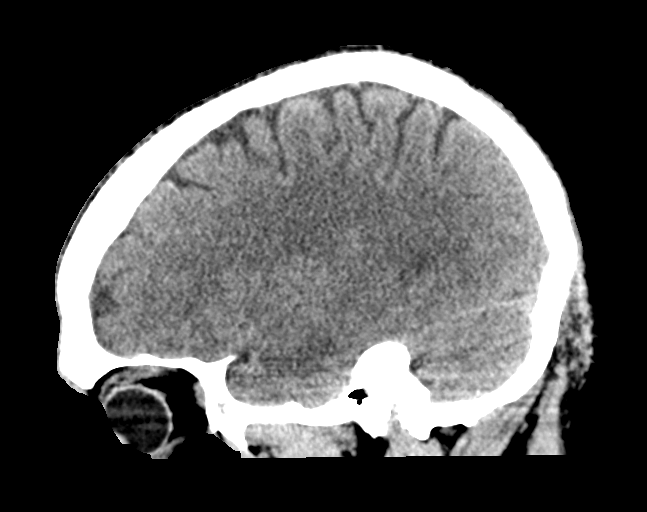
[im 30/60  brain]
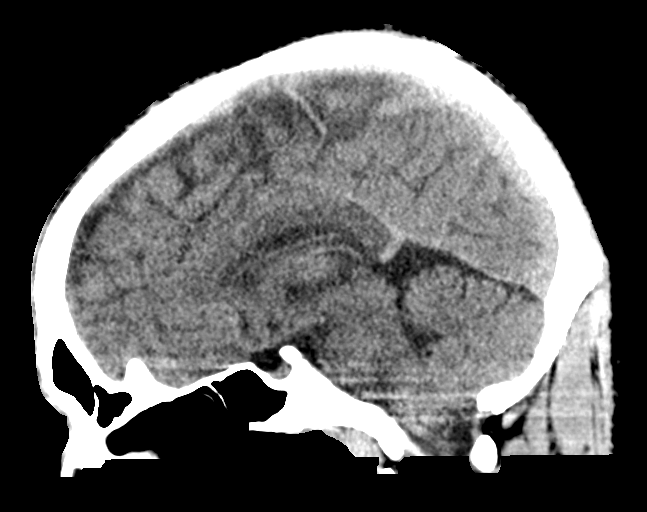
[im 40/60  brain]
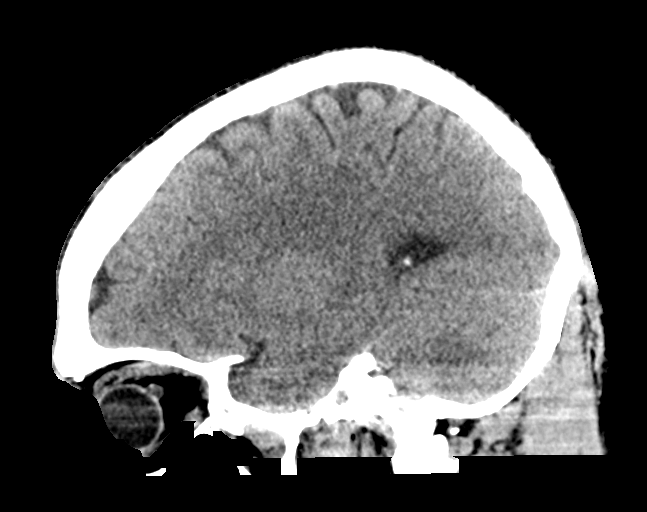

[16 of 47 positions shown; findings below may reference images not displayed]

FINDINGS: Brain: There is no acute intracranial hemorrhage, mass effect, or
edema. Gray-white differentiation is preserved. There is no
extra-axial fluid collection. Ventricles and sulci are within normal
limits in size and configuration.

Vascular: No hyperdense vessel or unexpected calcification.

Skull: Calvarium is unremarkable.

Sinuses/Orbits: No acute finding.

Other: None.
IMPRESSION: Normal CT of the head.

## 2022-05-26 ENCOUNTER — Ambulatory Visit: Payer: Self-pay

## 2022-06-02 ENCOUNTER — Ambulatory Visit: Payer: Commercial Managed Care - HMO | Admitting: General Surgery

## 2022-06-02 ENCOUNTER — Encounter: Payer: Self-pay | Admitting: General Surgery

## 2022-06-02 VITALS — BP 126/85 | HR 85 | Temp 98.8°F | Resp 16 | Ht 69.0 in | Wt 206.0 lb

## 2022-06-02 DIAGNOSIS — K4090 Unilateral inguinal hernia, without obstruction or gangrene, not specified as recurrent: Secondary | ICD-10-CM

## 2022-06-03 NOTE — Progress Notes (Signed)
Subjective:     [facility]  Patient returns for follow-up of a known left inguinal hernia.  He was referred back to my care as he has a new job and was told to have it evaluated.  I last saw him in my office on 07/31/2021.  At that time, he had an easily reducible left inguinal hernia that was not causing him any issues.  Surgery was deferred as he was asymptomatic.  Since that time, he has not had any issues.  He denies any nausea or vomiting.  He denies any left groin pain.  It is easily reducible.  It is not affecting his daily lifestyle or work. Objective:    BP 126/85   Pulse 85   Temp 98.8 F (37.1 C) (Oral)   Resp 16   Ht '5\' 9"'$  (1.753 m)   Wt 206 lb (93.4 kg)   SpO2 96%   BMI 30.42 kg/m   General:  alert, cooperative, and no distress  Head is normocephalic, atraumatic Lungs clear to auscultation with equal breath sounds bilaterally Heart examination reveals regular rate rhythm without S3, S4, murmurs Abdomen soft, nontender, nondistended.  A small easily reducible left inguinal hernia noted. Genitourinary examination within normal limits.     Assessment:    Stable left inguinal hernia.  No need for acute surgical intervention.  No need to adjust daily lifestyle.  No need for restrictions such as prolonged standing, sitting, or heavy lifting needed.    Plan:   Patient to follow-up with me as needed.

## 2022-10-31 IMAGING — US US EXTREM UP *R* LTD
1 series · 10 of 10 positions shown · non-contrast
Comparison: None.

CLINICAL DATA: Painless right shoulder soft tissue mass for 2-3
years

EXAM:
ULTRASOUND RIGHT UPPER EXTREMITY LIMITED
TECHNIQUE: Ultrasound examination of the upper extremity soft tissues was
performed in the area of clinical concern.

[Series 1: us extrem up *right* ltd · 0.06mm/px · 10 acquisitions, 10 frames shown]
[im 1/10]
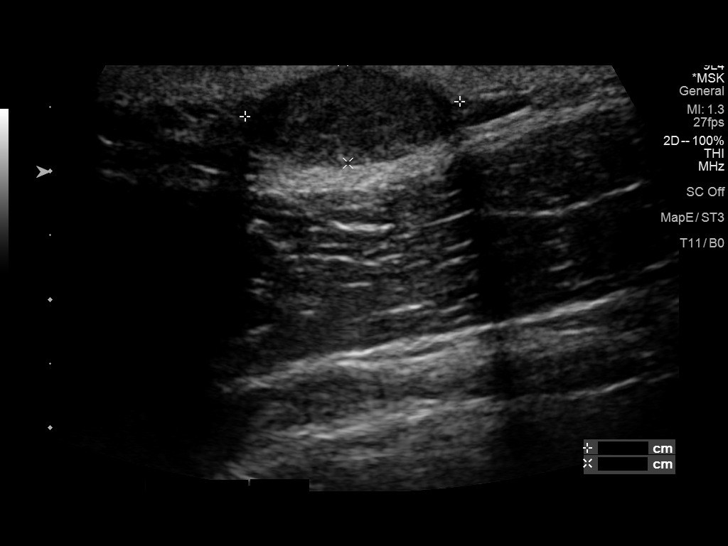
[im 2/10]
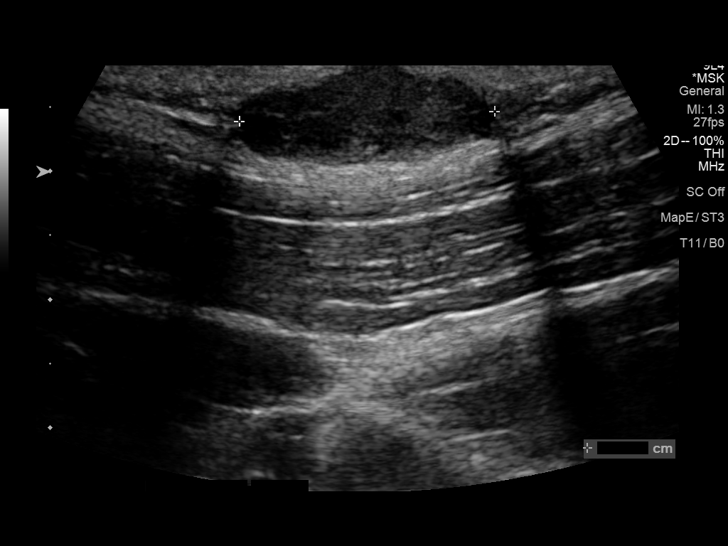
[im 3/10]
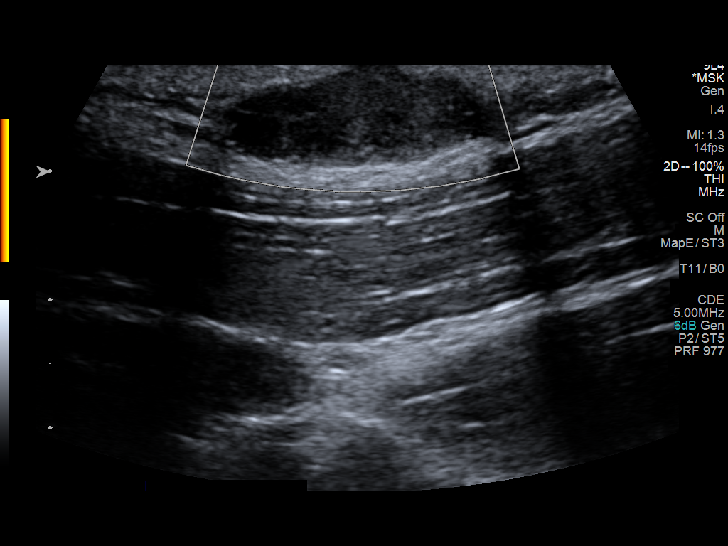
[im 4/10]
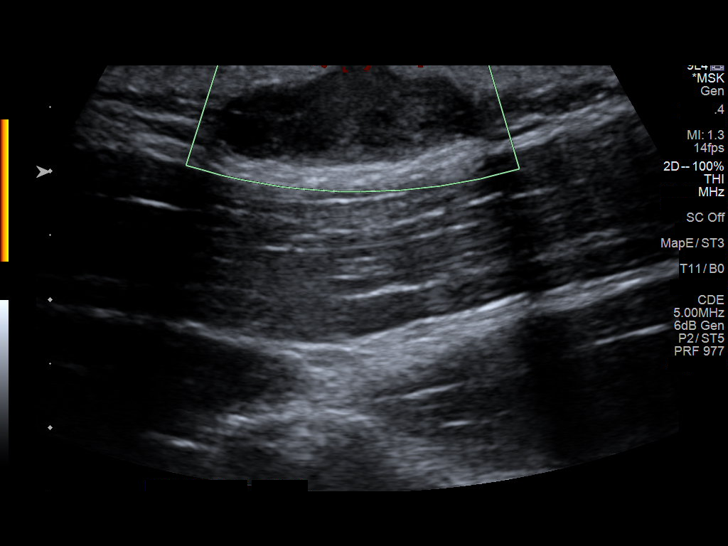
[im 5/10]
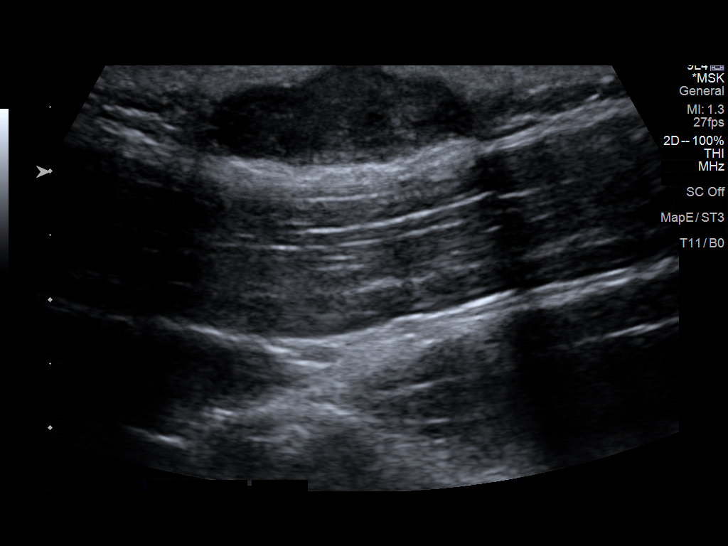
[im 6/10]
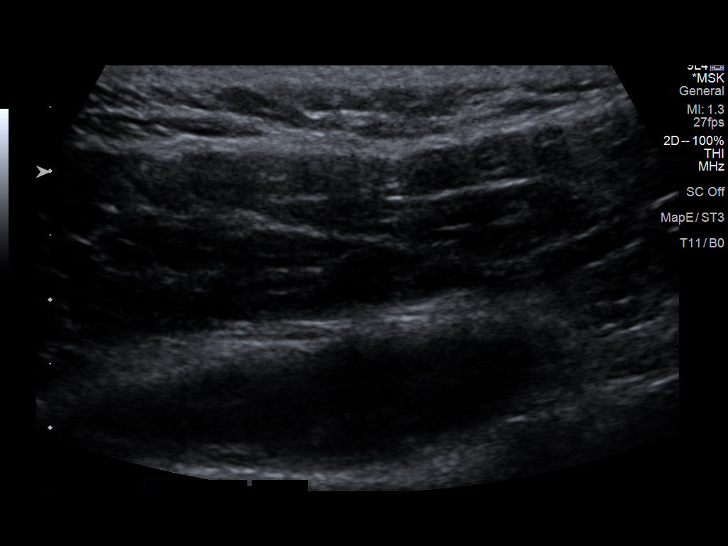
[im 7/10]
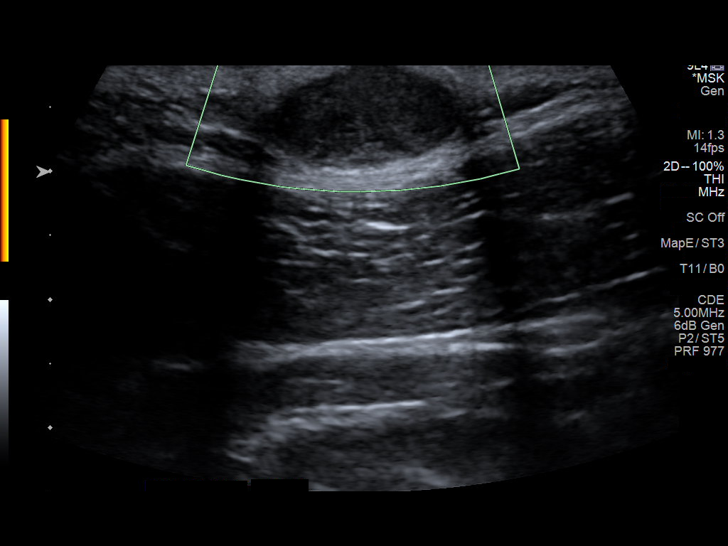
[im 8/10]
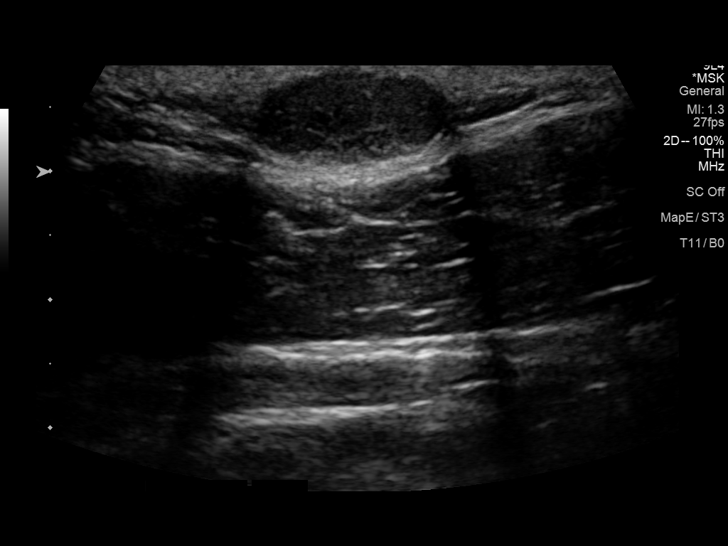
[im 9/10]
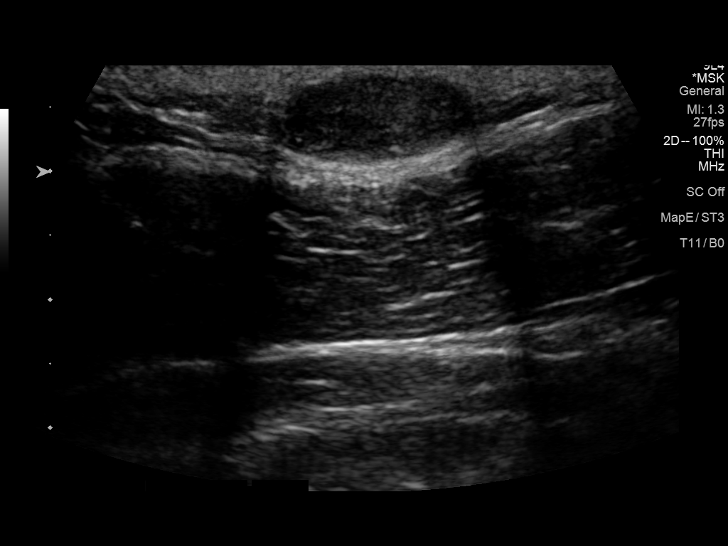
[im 10/10]
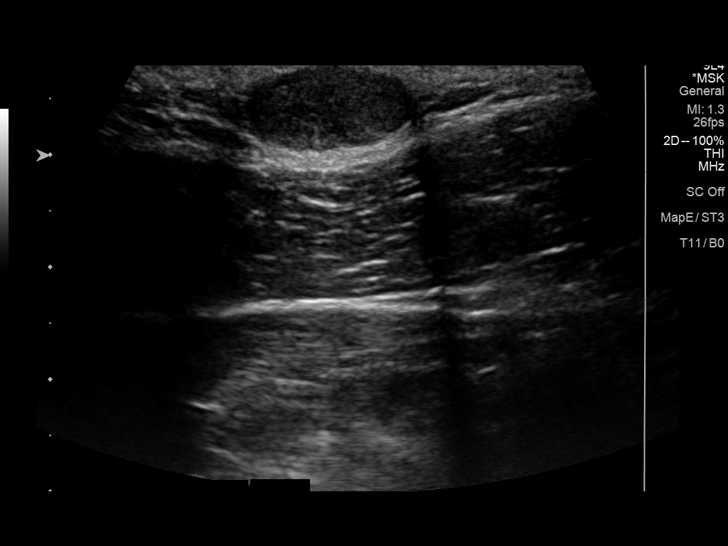

[10 of 10 positions shown; findings below may reference images not displayed]

FINDINGS: Targeted ultrasound was performed of the soft tissues of the right
upper extremity at site of patient's clinical concern. Within the
subcutaneous soft tissues is a well-defined slightly lobulated
hypoechoic soft tissue mass measuring 2.0 x 0.8 x 1.7 cm. No
internal vascularity with color Doppler. No surrounding soft tissue
edema or hyperemia. No direct connection to the overlying skin
surface is evident.
IMPRESSION: Solid-appearing nonvascular 2.0 cm superficial soft tissue mass of
the right shoulder, nonspecific. Echogenicity not typical of a
simple lipoma. If further imaging evaluation is clinically
warranted, a MRI of the shoulder without and with IV contrast could
be performed. Tissue sampling and/or excisional biopsy likely needed
for definitive diagnosis.
